# Patient Record
Sex: Male | Born: 1960 | Race: Black or African American | Hispanic: No | Marital: Single | Smoking: Current every day smoker
Health system: Southern US, Community
[De-identification: ages and names within clinical notes are randomized; demographics above are authoritative.]

## PROBLEM LIST (undated history)

## (undated) DIAGNOSIS — I10 Essential (primary) hypertension: Secondary | ICD-10-CM

## (undated) DIAGNOSIS — Z720 Tobacco use: Secondary | ICD-10-CM

## (undated) DIAGNOSIS — E119 Type 2 diabetes mellitus without complications: Secondary | ICD-10-CM

---

## 2016-07-15 ENCOUNTER — Encounter (HOSPITAL_COMMUNITY): Payer: Self-pay | Admitting: Emergency Medicine

## 2016-07-15 ENCOUNTER — Emergency Department (HOSPITAL_COMMUNITY): Payer: Self-pay

## 2016-07-15 ENCOUNTER — Observation Stay (HOSPITAL_COMMUNITY)
Admission: EM | Admit: 2016-07-15 | Discharge: 2016-07-15 | Disposition: A | Discharge status: Home / Self Care | Payer: Self-pay | Attending: Internal Medicine | Admitting: Internal Medicine

## 2016-07-15 DIAGNOSIS — F17210 Nicotine dependence, cigarettes, uncomplicated: Secondary | ICD-10-CM | POA: Insufficient documentation

## 2016-07-15 DIAGNOSIS — E876 Hypokalemia: Secondary | ICD-10-CM | POA: Insufficient documentation

## 2016-07-15 DIAGNOSIS — E119 Type 2 diabetes mellitus without complications: Secondary | ICD-10-CM | POA: Insufficient documentation

## 2016-07-15 DIAGNOSIS — Z7984 Long term (current) use of oral hypoglycemic drugs: Secondary | ICD-10-CM | POA: Insufficient documentation

## 2016-07-15 DIAGNOSIS — G459 Transient cerebral ischemic attack, unspecified: Principal | ICD-10-CM | POA: Diagnosis present

## 2016-07-15 DIAGNOSIS — I639 Cerebral infarction, unspecified: Secondary | ICD-10-CM

## 2016-07-15 DIAGNOSIS — I10 Essential (primary) hypertension: Secondary | ICD-10-CM | POA: Insufficient documentation

## 2016-07-15 DIAGNOSIS — G458 Other transient cerebral ischemic attacks and related syndromes: Secondary | ICD-10-CM

## 2016-07-15 DIAGNOSIS — Z720 Tobacco use: Secondary | ICD-10-CM

## 2016-07-15 HISTORY — DX: Type 2 diabetes mellitus without complications: E11.9

## 2016-07-15 HISTORY — DX: Tobacco use: Z72.0

## 2016-07-15 HISTORY — DX: Essential (primary) hypertension: I10

## 2016-07-15 LAB — I-STAT CHEM 8, ED
BUN: 16 mg/dL (ref 6–20)
CALCIUM ION: 1.05 mmol/L — AB (ref 1.15–1.40)
CHLORIDE: 102 mmol/L (ref 101–111)
CREATININE: 1.2 mg/dL (ref 0.61–1.24)
Glucose, Bld: 121 mg/dL — ABNORMAL HIGH (ref 65–99)
HCT: 41 % (ref 39.0–52.0)
Hemoglobin: 13.9 g/dL (ref 13.0–17.0)
Potassium: 2.9 mmol/L — ABNORMAL LOW (ref 3.5–5.1)
SODIUM: 140 mmol/L (ref 135–145)
TCO2: 26 mmol/L (ref 0–100)

## 2016-07-15 LAB — DIFFERENTIAL
Basophils Absolute: 0 10*3/uL (ref 0.0–0.1)
Basophils Relative: 0 %
Eosinophils Absolute: 0.3 10*3/uL (ref 0.0–0.7)
Eosinophils Relative: 3 %
LYMPHS ABS: 1.9 10*3/uL (ref 0.7–4.0)
LYMPHS PCT: 19 %
MONO ABS: 0.7 10*3/uL (ref 0.1–1.0)
Monocytes Relative: 7 %
NEUTROS ABS: 6.8 10*3/uL (ref 1.7–7.7)
NEUTROS PCT: 71 %

## 2016-07-15 LAB — LIPID PANEL
CHOL/HDL RATIO: 4.3 ratio
CHOLESTEROL: 196 mg/dL (ref 0–200)
HDL: 46 mg/dL (ref >40)
LDL Cholesterol: 138 mg/dL — ABNORMAL HIGH (ref 0–99)
TRIGLYCERIDES: 61 mg/dL (ref <150)
VLDL: 12 mg/dL (ref 0–40)

## 2016-07-15 LAB — COMPREHENSIVE METABOLIC PANEL
ALBUMIN: 3.4 g/dL — AB (ref 3.5–5.0)
ALK PHOS: 73 U/L (ref 38–126)
ALT: 23 U/L (ref 17–63)
AST: 29 U/L (ref 15–41)
Anion gap: 8 (ref 5–15)
BILIRUBIN TOTAL: 0.4 mg/dL (ref 0.3–1.2)
BUN: 15 mg/dL (ref 6–20)
CALCIUM: 8.6 mg/dL — AB (ref 8.9–10.3)
CO2: 26 mmol/L (ref 22–32)
CREATININE: 1.2 mg/dL (ref 0.61–1.24)
Chloride: 103 mmol/L (ref 101–111)
GFR calc Af Amer: >60 mL/min (ref >60)
GFR calc non Af Amer: >60 mL/min (ref >60)
GLUCOSE: 120 mg/dL — AB (ref 65–99)
Potassium: 2.9 mmol/L — ABNORMAL LOW (ref 3.5–5.1)
SODIUM: 137 mmol/L (ref 135–145)
Total Protein: 7.2 g/dL (ref 6.5–8.1)

## 2016-07-15 LAB — CBC
HCT: 39.3 % (ref 39.0–52.0)
HEMOGLOBIN: 12.8 g/dL — AB (ref 13.0–17.0)
MCH: 25.8 pg — ABNORMAL LOW (ref 26.0–34.0)
MCHC: 32.6 g/dL (ref 30.0–36.0)
MCV: 79.1 fL (ref 78.0–100.0)
PLATELETS: 322 10*3/uL (ref 150–400)
RBC: 4.97 MIL/uL (ref 4.22–5.81)
RDW: 16.4 % — ABNORMAL HIGH (ref 11.5–15.5)
WBC: 9.7 10*3/uL (ref 4.0–10.5)

## 2016-07-15 LAB — URINALYSIS, ROUTINE W REFLEX MICROSCOPIC
BILIRUBIN URINE: NEGATIVE
Glucose, UA: NEGATIVE mg/dL
Hgb urine dipstick: NEGATIVE
KETONES UR: NEGATIVE mg/dL
Leukocytes, UA: NEGATIVE
NITRITE: NEGATIVE
PROTEIN: NEGATIVE mg/dL
Specific Gravity, Urine: 1.015 (ref 1.005–1.030)
pH: 7.5 (ref 5.0–8.0)

## 2016-07-15 LAB — RAPID URINE DRUG SCREEN, HOSP PERFORMED
AMPHETAMINES: NOT DETECTED
BARBITURATES: NOT DETECTED
Benzodiazepines: NOT DETECTED
Cocaine: NOT DETECTED
Opiates: NOT DETECTED
TETRAHYDROCANNABINOL: NOT DETECTED

## 2016-07-15 LAB — ETHANOL: Alcohol, Ethyl (B): <5 mg/dL (ref <5)

## 2016-07-15 LAB — PROTIME-INR
INR: 1.08
PROTHROMBIN TIME: 14.1 s (ref 11.4–15.2)

## 2016-07-15 LAB — I-STAT TROPONIN, ED: Troponin i, poc: 0.02 ng/mL (ref 0.00–0.08)

## 2016-07-15 LAB — APTT: aPTT: 30 seconds (ref 24–36)

## 2016-07-15 LAB — CBG MONITORING, ED: GLUCOSE-CAPILLARY: 126 mg/dL — AB (ref 65–99)

## 2016-07-15 MED ORDER — ONDANSETRON HCL 4 MG/2ML IJ SOLN: 4.0000 mg | Freq: Three times a day (TID) | INTRAMUSCULAR | Status: DC | PRN

## 2016-07-15 MED ORDER — SENNOSIDES-DOCUSATE SODIUM 8.6-50 MG PO TABS
1.0000 | ORAL_TABLET | Freq: Every evening | ORAL | Status: DC | PRN
  Filled 2016-07-15: qty 1

## 2016-07-15 MED ORDER — INSULIN ASPART 100 UNIT/ML ~~LOC~~ SOLN: 0.0000 [IU] | Freq: Every day | SUBCUTANEOUS | Status: DC

## 2016-07-15 MED ORDER — STROKE: EARLY STAGES OF RECOVERY BOOK
Freq: Once | Status: DC
  Filled 2016-07-15: qty 1

## 2016-07-15 MED ORDER — CARVEDILOL 12.5 MG PO TABS: 25.0000 mg | ORAL_TABLET | Freq: Two times a day (BID) | ORAL | Status: DC

## 2016-07-15 MED ORDER — ASPIRIN 325 MG PO TABS: 325.0000 mg | ORAL_TABLET | Freq: Every day | ORAL | Status: DC

## 2016-07-15 MED ORDER — ZOLPIDEM TARTRATE 5 MG PO TABS: 5.0000 mg | ORAL_TABLET | Freq: Every evening | ORAL | Status: DC | PRN

## 2016-07-15 MED ORDER — ASPIRIN 300 MG RE SUPP: 300.0000 mg | Freq: Every day | RECTAL | Status: DC

## 2016-07-15 MED ORDER — POTASSIUM CHLORIDE CRYS ER 20 MEQ PO TBCR: 20.0000 meq | EXTENDED_RELEASE_TABLET | Freq: Once | ORAL | Status: DC

## 2016-07-15 MED ORDER — IOPAMIDOL (ISOVUE-370) INJECTION 76%
INTRAVENOUS | Status: AC
  Administered 2016-07-15: 75 mL
  Filled 2016-07-15: qty 100

## 2016-07-15 MED ORDER — ENOXAPARIN SODIUM 40 MG/0.4ML ~~LOC~~ SOLN: 40.0000 mg | SUBCUTANEOUS | Status: DC

## 2016-07-15 MED ORDER — ATORVASTATIN CALCIUM 40 MG PO TABS
40.0000 mg | ORAL_TABLET | Freq: Every day | ORAL | Status: DC
Start: 2016-07-15 — End: 2016-07-15

## 2016-07-15 MED ORDER — SODIUM CHLORIDE 0.9 % IV SOLN: INTRAVENOUS | Status: DC

## 2016-07-15 MED ORDER — INSULIN ASPART 100 UNIT/ML ~~LOC~~ SOLN
0.0000 [IU] | Freq: Three times a day (TID) | SUBCUTANEOUS | Status: DC
Start: 2016-07-15 — End: 2016-07-15

## 2016-07-15 MED ORDER — NICOTINE 21 MG/24HR TD PT24: 21.0000 mg | MEDICATED_PATCH | Freq: Every day | TRANSDERMAL | Status: DC

## 2016-07-15 MED ORDER — POTASSIUM CHLORIDE CRYS ER 20 MEQ PO TBCR
40.0000 meq | EXTENDED_RELEASE_TABLET | Freq: Once | ORAL | Status: AC
  Administered 2016-07-15: 40 meq via ORAL
  Filled 2016-07-15: qty 2

## 2016-07-15 MED ORDER — MAGNESIUM SULFATE 2 GM/50ML IV SOLN
2.0000 g | Freq: Once | INTRAVENOUS | Status: AC
  Administered 2016-07-15: 2 g via INTRAVENOUS
  Filled 2016-07-15: qty 50

## 2016-07-15 MED ORDER — ACETAMINOPHEN 325 MG PO TABS: 650.0000 mg | ORAL_TABLET | Freq: Four times a day (QID) | ORAL | Status: DC | PRN

## 2016-07-15 NOTE — ED Triage Notes (Signed)
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 BP 160/90, HR 72, R-20, SPO2 100 % RA.

## 2016-07-15 NOTE — ED Provider Notes (Addendum)
[facility]-EMERGENCY DEPT Provider Note   CSN: [id number] Arrival date & time: 07/15/16  0201   By signing my name below, I, Nelwyn Salisbury, attest that this documentation has been prepared under the direction and in the presence of Derwood Kaplan, MD . Electronically Signed: Nelwyn Salisbury, Scribe. 07/15/2016. 2:44 AM.  History   Chief Complaint Chief Complaint  Patient presents with  . Weakness   The history is provided by the patient. No language interpreter was used.    HPI Comments:  Adam Kaufman is a 55 y.o. male with pmhx of DM brought in by ambulance, who presents to the Emergency Department complaining of sudden-onset resolving weakness beginning about 3 hours ago. Pt states he went to sleep in his truck, woke up around midnight and noticed some weakness and left-sided numbness. He notes that his symptoms persisted for an hour after onset, but began resolving once EMS arrived. No modifying factors indicated. Pt denies any other new symptoms. He reports smoking 1 pack of cigarettes a day, but does not do any recreational drugs or drink alcohol.      Past Medical History:  Diagnosis Date  . Diabetes mellitus without complication (HCC)   . Hypertension   . Tobacco abuse     Patient Active Problem List   Diagnosis Date Noted  . TIA (transient ischemic attack) 07/15/2016  . Hypertension   . Diabetes mellitus without complication (HCC)   . Tobacco abuse   . Essential hypertension   . Hypokalemia     History reviewed. No pertinent surgical history.     Home Medications    Prior to Admission medications   Medication Sig Start Date End Date Taking? Authorizing Provider  carvedilol (COREG) 25 MG tablet Take 25 mg by mouth 2 (two) times daily with a meal.   Yes Historical Provider, MD  glipiZIDE (GLUCOTROL) 10 MG tablet Take 10 mg by mouth daily before breakfast.   Yes Historical Provider, MD    Family History History reviewed. No pertinent family history.  Social  History Social History  Substance Use Topics  . Smoking status: Current Every Day Smoker    Packs/day: 1.00    Types: Cigarettes  . Smokeless tobacco: Never Used  . Alcohol use No     Allergies   Patient has no known allergies.   Review of Systems Review of Systems 10 Systems reviewed and are negative for acute change except as noted in the HPI.  Physical Exam Updated Vital Signs BP 168/94   Pulse 68   Temp 98.6 F (37 C) (Oral)   Resp 17   Ht 6\' 1"  (1.854 m)   Wt 282 lb 10.1 oz (128.2 kg)   SpO2 96%   BMI 37.29 kg/m   Physical Exam  Constitutional: He is oriented to person, place, and time. He appears well-developed and well-nourished.  HENT:  Head: Normocephalic and atraumatic.  Eyes: EOM are normal. Pupils are equal, round, and reactive to light.  Pupils 1mm  Neck: Normal range of motion.  Cardiovascular: Normal rate, regular rhythm, normal heart sounds and intact distal pulses.   Pulmonary/Chest: Effort normal and breath sounds normal. No respiratory distress.  Abdominal: Soft. He exhibits no distension. There is no tenderness.  Musculoskeletal: Normal range of motion.  Neurological: He is alert and oriented to person, place, and time.  Cranial nerve 2-12 intact. Gross upper and lower sensory exam normal. Upper and lower extremity motor exam normal. Cerebellar exam shows no dysmetria. NIH stroke scale: 0  Skin:  Skin is warm and dry.  Psychiatric: He has a normal mood and affect. Judgment normal.  Nursing note and vitals reviewed.    ED Treatments / Results  DIAGNOSTIC STUDIES:  Oxygen Saturation is 97% on RA, normal by my interpretation.    COORDINATION OF CARE:  3:08 AM Discussed treatment plan with pt at bedside which includes correspondence with neurology and pt agreed to plan.  Labs (all labs ordered are listed, but only abnormal results are displayed) Labs Reviewed  CBC - Abnormal; Notable for the following:       Result Value   Hemoglobin  12.8 (*)    MCH 25.8 (*)    RDW 16.4 (*)    All other components within normal limits  COMPREHENSIVE METABOLIC PANEL - Abnormal; Notable for the following:    Potassium 2.9 (*)    Glucose, Bld 120 (*)    Calcium 8.6 (*)    Albumin 3.4 (*)    All other components within normal limits  URINALYSIS, ROUTINE W REFLEX MICROSCOPIC (NOT AT [facility]) - Abnormal; Notable for the following:    Color, Urine STRAW (*)    All other components within normal limits  I-STAT CHEM 8, ED - Abnormal; Notable for the following:    Potassium 2.9 (*)    Glucose, Bld 121 (*)    Calcium, Ion 1.05 (*)    All other components within normal limits  CBG MONITORING, ED - Abnormal; Notable for the following:    Glucose-Capillary 126 (*)    All other components within normal limits  ETHANOL  PROTIME-INR  APTT  DIFFERENTIAL  RAPID URINE DRUG SCREEN, HOSP PERFORMED  HEMOGLOBIN A1C  LIPID PANEL  I-STAT TROPOININ, ED    EKG  EKG Interpretation  Date/Time:  Monday July 15 2016 02:44:04 EST Ventricular Rate:  79 PR Interval:    QRS Duration: 102 QT Interval:  419 QTC Calculation: 481 R Axis:   31 Text Interpretation:  Sinus rhythm Probable left atrial enlargement LVH with secondary repolarization abnormality Borderline prolonged QT interval Confirmed by Rhunette Croft, MD, Janey Genta [phone]) on 07/15/2016 4:22:57 AM       Radiology Ct Angio Head W Or Wo Contrast  Result Date: 07/15/2016 CLINICAL DATA:  Left-sided facial droop EXAM: CT ANGIOGRAPHY HEAD AND NECK TECHNIQUE: Multidetector CT imaging of the head and neck was performed using the standard protocol during bolus administration of intravenous contrast. Multiplanar CT image reconstructions and MIPs were obtained to evaluate the vascular anatomy. Carotid stenosis measurements (when applicable) are obtained utilizing NASCET criteria, using the distal internal carotid diameter as the denominator. CONTRAST:  75 mL Isovue 370 IV injection of 4 milliliters/second  COMPARISON:  Head CT same day. FINDINGS: CTA NECK FINDINGS Aortic arch: There is a variant aortic arch branching pattern with the left vertebral artery arising independently from the arch. There is mild atherosclerotic calcification in the arch. Proximal subclavian arteries are normal. Right carotid system: There is no dissection, aneurysm or hemodynamically significant stenosis. Left carotid system: No dissection, aneurysm or hemodynamically significant stenosis. Vertebral arteries: Vertebral artery origins are patent. The vertebral system is right dominant. No evidence of aneurysm or dissection. Skeleton: Cervical degenerative changes without advanced bony spinal canal stenosis. Other neck: The nasopharynx is clear. The oropharynx and hypopharynx are normal. The epiglottis is normal. The supraglottic larynx, glottis and subglottic larynx are normal. No retropharyngeal collection. The parapharyngeal spaces are preserved. The parotid and submandibular glands are normal. No sialolithiasis or salivary ductal dilatation. The thyroid gland is normal.  There is no cervical lymphadenopathy. Upper chest: Clear Review of the MIP images confirms the above findings CTA HEAD FINDINGS Anterior circulation: --Intracranial internal carotid arteries: Minimal atherosclerotic calcification of the skullbase. --Anterior cerebral arteries: Normal. --Middle cerebral arteries: Normal. --Posterior communicating arteries: Present bilaterally. Posterior circulation: --Posterior cerebral arteries: Normal. --Superior cerebellar arteries: Normal. --Basilar artery: Normal. --Anterior inferior cerebellar arteries: Normal on the right. Not visualized on the left, which is not uncommon. --Posterior inferior cerebellar arteries: Normal. Venous sinuses: As permitted by contrast timing, patent. Anatomic variants: None Delayed phase: Not performed Review of the MIP images confirms the above findings IMPRESSION: 1. No occlusion or high-grade stenosis of  the intracranial arteries. 2. No aneurysm, dissection or hemodynamically significant stenosis of the cervical carotid and vertebral arteries. Electronically Signed   By: Deatra RobinsonKevin  Herman M.D.   On: 07/15/2016 03:07   Ct Angio Neck W And/or Wo Contrast  Result Date: 07/15/2016 CLINICAL DATA:  Left-sided facial droop EXAM: CT ANGIOGRAPHY HEAD AND NECK TECHNIQUE: Multidetector CT imaging of the head and neck was performed using the standard protocol during bolus administration of intravenous contrast. Multiplanar CT image reconstructions and MIPs were obtained to evaluate the vascular anatomy. Carotid stenosis measurements (when applicable) are obtained utilizing NASCET criteria, using the distal internal carotid diameter as the denominator. CONTRAST:  75 mL Isovue 370 IV injection of 4 milliliters/second COMPARISON:  Head CT same day. FINDINGS: CTA NECK FINDINGS Aortic arch: There is a variant aortic arch branching pattern with the left vertebral artery arising independently from the arch. There is mild atherosclerotic calcification in the arch. Proximal subclavian arteries are normal. Right carotid system: There is no dissection, aneurysm or hemodynamically significant stenosis. Left carotid system: No dissection, aneurysm or hemodynamically significant stenosis. Vertebral arteries: Vertebral artery origins are patent. The vertebral system is right dominant. No evidence of aneurysm or dissection. Skeleton: Cervical degenerative changes without advanced bony spinal canal stenosis. Other neck: The nasopharynx is clear. The oropharynx and hypopharynx are normal. The epiglottis is normal. The supraglottic larynx, glottis and subglottic larynx are normal. No retropharyngeal collection. The parapharyngeal spaces are preserved. The parotid and submandibular glands are normal. No sialolithiasis or salivary ductal dilatation. The thyroid gland is normal. There is no cervical lymphadenopathy. Upper chest: Clear Review of the  MIP images confirms the above findings CTA HEAD FINDINGS Anterior circulation: --Intracranial internal carotid arteries: Minimal atherosclerotic calcification of the skullbase. --Anterior cerebral arteries: Normal. --Middle cerebral arteries: Normal. --Posterior communicating arteries: Present bilaterally. Posterior circulation: --Posterior cerebral arteries: Normal. --Superior cerebellar arteries: Normal. --Basilar artery: Normal. --Anterior inferior cerebellar arteries: Normal on the right. Not visualized on the left, which is not uncommon. --Posterior inferior cerebellar arteries: Normal. Venous sinuses: As permitted by contrast timing, patent. Anatomic variants: None Delayed phase: Not performed Review of the MIP images confirms the above findings IMPRESSION: 1. No occlusion or high-grade stenosis of the intracranial arteries. 2. No aneurysm, dissection or hemodynamically significant stenosis of the cervical carotid and vertebral arteries. Electronically Signed   By: Deatra RobinsonKevin  Herman M.D.   On: 07/15/2016 03:07   Ct Head Code Stroke Wo Contrast`  Result Date: 07/15/2016 CLINICAL DATA:  Code stroke.  Left-sided facial droop EXAM: CT HEAD WITHOUT CONTRAST TECHNIQUE: Contiguous axial images were obtained from the base of the skull through the vertex without intravenous contrast. COMPARISON:  None. FINDINGS: Brain: No mass lesion, intraparenchymal hemorrhage or extra-axial collection. No evidence of acute cortical infarct. Brain parenchyma and CSF-containing spaces are normal for age. Vascular: No hyperdense vessel  or unexpected calcification. Skull: Normal visualized skull base, calvarium and extracranial soft tissues. Sinuses/Orbits: No sinus fluid levels or advanced mucosal thickening. No mastoid effusion. Normal orbits. ASPECTS [facility]([place] Stroke Program Early CT Score) - Ganglionic level infarction (caudate, lentiform nuclei, internal capsule, insula, M1-M3 cortex): 7 - Supraganglionic infarction (M4-M6 cortex):  3 Total score (0-10 with 10 being normal): 10 IMPRESSION: 1. No acute intracranial abnormality. 2. ASPECTS is 10. These results were called by telephone at the time of interpretation on 07/15/2016 at 2:31 am to Dr. Caryl PinaEric Lindzen, who verbally acknowledged these results. Electronically Signed   By: Deatra RobinsonKevin  Herman M.D.   On: 07/15/2016 02:30    Procedures Procedures (including critical care time)  Medications Ordered in ED Medications  potassium chloride SA (K-DUR,KLOR-CON) CR tablet 20 mEq (not administered)  carvedilol (COREG) tablet 25 mg (not administered)  nicotine (NICODERM CQ - dosed in mg/24 hours) patch 21 mg (not administered)  insulin aspart (novoLOG) injection 0-9 Units (not administered)  insulin aspart (novoLOG) injection 0-5 Units (not administered)  0.9 %  sodium chloride infusion (not administered)   stroke: mapping our early stages of recovery book (not administered)  senna-docusate (Senokot-S) tablet 1 tablet (not administered)  enoxaparin (LOVENOX) injection 40 mg (not administered)  aspirin suppository 300 mg (not administered)    Or  aspirin tablet 325 mg (not administered)  acetaminophen (TYLENOL) tablet 650 mg (not administered)  ondansetron (ZOFRAN) injection 4 mg (not administered)  zolpidem (AMBIEN) tablet 5 mg (not administered)  atorvastatin (LIPITOR) tablet 40 mg (not administered)  iopamidol (ISOVUE-370) 76 % injection (75 mLs  Contrast Given 07/15/16 0230)  potassium chloride SA (K-DUR,KLOR-CON) CR tablet 40 mEq (40 mEq Oral Given 07/15/16 0400)  magnesium sulfate IVPB 2 g 50 mL (2 g Intravenous New Bag/Given 07/15/16 0401)     Initial Impression / Assessment and Plan / ED Course  I have reviewed the triage vital signs and the nursing notes.  Pertinent labs & imaging results that were available during my care of the patient were reviewed by me and considered in my medical decision making (see chart for details).  Clinical Course    Pt comes in with  slurred speech and L sided weakness. Symptoms resolved. Suspect TIA.  ABCD2 score is 4 - moderate risk. Code stroke was called on the field -so neuro at bedside. CT -A ordered by hem and is neg for any occlusion.Pt has low K - replaced.  Neuro recommends admisison.   5:18 AM Patient wants to leave against medical advice. Patient understands that his actions will lead to inadequate medical workup, and that he is at risk of complications of missed diagnosis, which includes morbidity and mortality.  Pt understands that negative CT head, CT-angio doesn't mean everything is fine. Also MRI will pick up small strokes and echo will r/o and clots in the heart. Pt just wants to go home and take the chance. Patient is demonstrating good capacity to make decision. Patient understands that he/she needs to return to the ER immediately if his symptoms get worse.   Final Clinical Impressions(s) / ED Diagnoses   Final diagnoses:  Hypokalemia  Transient cerebral ischemia, unspecified type  Acute anterior circulation TIA  Essential hypertension  Diabetes mellitus without complication (HCC)  Tobacco abuse  TIA (transient ischemic attack)  TIA (transient ischemic attack)    New Prescriptions New Prescriptions   No medications on file  I personally performed the services described in this documentation, which was scribed in my presence. The  recorded information has been reviewed and is accurate.    Derwood Kaplan, MD 07/15/16 1610    Derwood Kaplan, MD 07/15/16 [id number]    Derwood Kaplan, MD 07/15/16 [id number]

## 2016-07-15 NOTE — ED Notes (Signed)
NIH = 0. Patient passed stroke swallow screen. No neuro deficits. Patient ambulatory. Uses urinal without assistance.  Next neuro check at 0600. Lucky RathkeBonnie Arieanna Pressey, RN  [phone]

## 2016-07-15 NOTE — H&P (Signed)
History and Physical    Adam Kaufman ZOX:[id number] DOB: [birth year] DOA: 07/15/2016  Referring MD/NP/PA:   PCP: No primary care provider on file.   Patient coming from:  The patient is coming from home.  At baseline, pt is independent for most of ADL.  Chief Complaint: Slurred speech, left facial numbness  HPI: Adam Kaufman is a 55 y.o. male with medical history significant of tobacco abuse, hypertension, diabetes mellitus, who presents with a slurred speech and left facial numbness.  pt is a Naval [employer]. He states that he went to sleep on the truck last night at 2100, feeling well, but when woke up at about 0030, he felt weak, numbness on left side of the face, had slurred speech. The symptoms lasted for about 45-50 minutes, resolved spontaneously. Patient denies unilateral weakness in extremities, vision change or hearing loss. He denies chest pain, shortness of breath, cough, fever, chills, nausea, vomiting, abdominal pain, symptoms of UTI. pt got 324 mg ASA on the truck station  ED Course: pt was found to have WBC 9.7, negative troponin, negative UDS, INR 1.08, negative urinalysis, potassium 2.9, creatinine 1.2, temperature normal, no tachycardia, no tachypnea, negative CT had for acute intracranial abnormalities. Negative CT angiogram of both head and neck. Pt is placed on tele bed for obs. Neurology was consulted.  Review of Systems:   General: no fevers, chills, no changes in body weight, has fatigue HEENT: no blurry vision, hearing changes or sore throat Respiratory: no dyspnea, coughing, wheezing CV: no chest pain, no palpitations GI: no nausea, vomiting, abdominal pain, diarrhea, constipation GU: no dysuria, burning on urination, increased urinary frequency, hematuria  Ext: no leg edema Neuro: has left facial numbness and slurred speech. no vision change or hearing loss Skin: no rash, no skin tear. MSK: No muscle spasm, no deformity, no limitation of range of movement in  spin Heme: No easy bruising.  Travel history: No recent long distant travel.  Allergy: No Known Allergies  Past Medical History:  Diagnosis Date  . Diabetes mellitus without complication (HCC)   . Hypertension   . Tobacco abuse     History reviewed. No pertinent surgical history.  Social History:  reports that he has been smoking Cigarettes.  He has been smoking about 1.00 pack per day. He has never used smokeless tobacco. He reports that he does not drink alcohol or use drugs.  Family History: Reviewed with patient, but patient does not know any empty medical history.   Prior to Admission medications   Medication Sig Start Date End Date Taking? Authorizing Provider  carvedilol (COREG) 25 MG tablet Take 25 mg by mouth 2 (two) times daily with a meal.   Yes Historical Provider, MD  glipiZIDE (GLUCOTROL) 10 MG tablet Take 10 mg by mouth daily before breakfast.   Yes Historical Provider, MD    Physical Exam: Vitals:   07/15/16 0254 07/15/16 0400 07/15/16 0500 07/15/16 0515  BP:  168/94 160/97 156/91  Pulse:  68    Resp:  Temp:      TempSrc:      SpO2:  96%    Weight: 128.2 kg (282 lb 10.1 oz)     Height:  (1.854 m)      General: Not in acute distress HEENT:       Eyes: PERRL, EOMI, no scleral icterus.       ENT: No discharge from the ears and nose, no pharynx injection, no tonsillar enlargement.  Neck: No JVD, no bruit, no mass felt. Heme: No neck lymph node enlargement. Cardiac: S1/S2, RRR, No murmurs, No gallops or rubs. Respiratory: Good air movement bilaterally. No rales, wheezing, rhonchi or rubs. GI: Soft, nondistended, nontender, no rebound pain, no organomegaly, BS present. GU: No hematuria Ext: No pitting leg edema bilaterally. 2+DP/PT pulse bilaterally. Musculoskeletal: No joint deformities, No joint redness or warmth, no limitation of ROM in spin. Skin: No rashes.  Neuro: Alert, oriented X3, cranial nerves II-XII grossly intact, moves  all extremities normally. Muscle strength 5/5 in all extremities, sensation to light touch intact. Brachial reflex 2+ bilaterally. Knee reflex 1+ bilaterally. Negative Babinski's sign. Normal finger to nose test. Psych: Patient is not psychotic, no suicidal or hemocidal ideation.  Labs on Admission: I have personally reviewed following labs and imaging studies  CBC:  Recent Labs Lab 07/15/16 0205 07/15/16 0210  WBC 9.7  --   NEUTROABS 6.8  --   HGB 12.8* 13.9  HCT 39.3 41.0  MCV 79.1  --   PLT 322  --    Basic Metabolic Panel:  Recent Labs Lab 07/15/16 0205 07/15/16 0210  NA 137 140  K 2.9* 2.9*  CL 103 102  CO2 26  --   GLUCOSE 120* 121*  BUN 15 16  CREATININE 1.20 1.20  CALCIUM 8.6*  --    GFR: Estimated Creatinine Clearance: 96.4 mL/min (by C-G formula based on SCr of 1.2 mg/dL). Liver Function Tests:  Recent Labs Lab 07/15/16 0205  AST 29  ALT 23  ALKPHOS 73  BILITOT 0.4  PROT 7.2  ALBUMIN 3.4*   No results for input(s): LIPASE, AMYLASE in the last 168 hours. No results for input(s): AMMONIA in the last 168 hours. Coagulation Profile:  Recent Labs Lab 07/15/16 0205  INR 1.08   Cardiac Enzymes: No results for input(s): CKTOTAL, CKMB, CKMBINDEX, TROPONINI in the last 168 hours. BNP (last 3 results) No results for input(s): PROBNP in the last 8760 hours. HbA1C: No results for input(s): HGBA1C in the last 72 hours. CBG:  Recent Labs Lab 07/15/16 0246  GLUCAP 126*   Lipid Profile: No results for input(s): CHOL, HDL, LDLCALC, TRIG, CHOLHDL, LDLDIRECT in the last 72 hours. Thyroid Function Tests: No results for input(s): TSH, T4TOTAL, FREET4, T3FREE, THYROIDAB in the last 72 hours. Anemia Panel: No results for input(s): VITAMINB12, FOLATE, FERRITIN, TIBC, IRON, RETICCTPCT in the last 72 hours. Urine analysis:    Component Value Date/Time   COLORURINE STRAW (A) 07/15/2016 0203   APPEARANCEUR CLEAR 07/15/2016 0203   LABSPEC 1.015 07/15/2016  0203   PHURINE 7.5 07/15/2016 0203   GLUCOSEU NEGATIVE 07/15/2016 0203   HGBUR NEGATIVE 07/15/2016 0203   BILIRUBINUR NEGATIVE 07/15/2016 0203   KETONESUR NEGATIVE 07/15/2016 0203   PROTEINUR NEGATIVE 07/15/2016 0203   NITRITE NEGATIVE 07/15/2016 0203   LEUKOCYTESUR NEGATIVE 07/15/2016 0203   Sepsis Labs: @LABRCNTIP (procalcitonin:4,lacticidven:4) )No results found for this or any previous visit (from the past 240 hour(s)).   Radiological Exams on Admission: Ct Angio Head W Or Wo Contrast  Result Date: 07/15/2016 CLINICAL DATA:  Left-sided facial droop EXAM: CT ANGIOGRAPHY HEAD AND NECK TECHNIQUE: Multidetector CT imaging of the head and neck was performed using the standard protocol during bolus administration of intravenous contrast. Multiplanar CT image reconstructions and MIPs were obtained to evaluate the vascular anatomy. Carotid stenosis measurements (when applicable) are obtained utilizing NASCET criteria, using the distal internal carotid diameter as the denominator. CONTRAST:  75 mL Isovue 370 IV injection of  4 milliliters/second COMPARISON:  Head CT same day. FINDINGS: CTA NECK FINDINGS Aortic arch: There is a variant aortic arch branching pattern with the left vertebral artery arising independently from the arch. There is mild atherosclerotic calcification in the arch. Proximal subclavian arteries are normal. Right carotid system: There is no dissection, aneurysm or hemodynamically significant stenosis. Left carotid system: No dissection, aneurysm or hemodynamically significant stenosis. Vertebral arteries: Vertebral artery origins are patent. The vertebral system is right dominant. No evidence of aneurysm or dissection. Skeleton: Cervical degenerative changes without advanced bony spinal canal stenosis. Other neck: The nasopharynx is clear. The oropharynx and hypopharynx are normal. The epiglottis is normal. The supraglottic larynx, glottis and subglottic larynx are normal. No  retropharyngeal collection. The parapharyngeal spaces are preserved. The parotid and submandibular glands are normal. No sialolithiasis or salivary ductal dilatation. The thyroid gland is normal. There is no cervical lymphadenopathy. Upper chest: Clear Review of the MIP images confirms the above findings CTA HEAD FINDINGS Anterior circulation: --Intracranial internal carotid arteries: Minimal atherosclerotic calcification of the skullbase. --Anterior cerebral arteries: Normal. --Middle cerebral arteries: Normal. --Posterior communicating arteries: Present bilaterally. Posterior circulation: --Posterior cerebral arteries: Normal. --Superior cerebellar arteries: Normal. --Basilar artery: Normal. --Anterior inferior cerebellar arteries: Normal on the right. Not visualized on the left, which is not uncommon. --Posterior inferior cerebellar arteries: Normal. Venous sinuses: As permitted by contrast timing, patent. Anatomic variants: None Delayed phase: Not performed Review of the MIP images confirms the above findings IMPRESSION: 1. No occlusion or high-grade stenosis of the intracranial arteries. 2. No aneurysm, dissection or hemodynamically significant stenosis of the cervical carotid and vertebral arteries. Electronically Signed   By: Deatra Robinson M.D.   On: 07/15/2016 03:07   Ct Angio Neck W And/or Wo Contrast  Result Date: 07/15/2016 CLINICAL DATA:  Left-sided facial droop EXAM: CT ANGIOGRAPHY HEAD AND NECK TECHNIQUE: Multidetector CT imaging of the head and neck was performed using the standard protocol during bolus administration of intravenous contrast. Multiplanar CT image reconstructions and MIPs were obtained to evaluate the vascular anatomy. Carotid stenosis measurements (when applicable) are obtained utilizing NASCET criteria, using the distal internal carotid diameter as the denominator. CONTRAST:  75 mL Isovue 370 IV injection of 4 milliliters/second COMPARISON:  Head CT same day. FINDINGS: CTA NECK  FINDINGS Aortic arch: There is a variant aortic arch branching pattern with the left vertebral artery arising independently from the arch. There is mild atherosclerotic calcification in the arch. Proximal subclavian arteries are normal. Right carotid system: There is no dissection, aneurysm or hemodynamically significant stenosis. Left carotid system: No dissection, aneurysm or hemodynamically significant stenosis. Vertebral arteries: Vertebral artery origins are patent. The vertebral system is right dominant. No evidence of aneurysm or dissection. Skeleton: Cervical degenerative changes without advanced bony spinal canal stenosis. Other neck: The nasopharynx is clear. The oropharynx and hypopharynx are normal. The epiglottis is normal. The supraglottic larynx, glottis and subglottic larynx are normal. No retropharyngeal collection. The parapharyngeal spaces are preserved. The parotid and submandibular glands are normal. No sialolithiasis or salivary ductal dilatation. The thyroid gland is normal. There is no cervical lymphadenopathy. Upper chest: Clear Review of the MIP images confirms the above findings CTA HEAD FINDINGS Anterior circulation: --Intracranial internal carotid arteries: Minimal atherosclerotic calcification of the skullbase. --Anterior cerebral arteries: Normal. --Middle cerebral arteries: Normal. --Posterior communicating arteries: Present bilaterally. Posterior circulation: --Posterior cerebral arteries: Normal. --Superior cerebellar arteries: Normal. --Basilar artery: Normal. --Anterior inferior cerebellar arteries: Normal on the right. Not visualized on the left, which is  not uncommon. --Posterior inferior cerebellar arteries: Normal. Venous sinuses: As permitted by contrast timing, patent. Anatomic variants: None Delayed phase: Not performed Review of the MIP images confirms the above findings IMPRESSION: 1. No occlusion or high-grade stenosis of the intracranial arteries. 2. No aneurysm,  dissection or hemodynamically significant stenosis of the cervical carotid and vertebral arteries. Electronically Signed   By: Deatra RobinsonKevin  Herman M.D.   On: 07/15/2016 03:07   Ct Head Code Stroke Wo Contrast`  Result Date: 07/15/2016 CLINICAL DATA:  Code stroke.  Left-sided facial droop EXAM: CT HEAD WITHOUT CONTRAST TECHNIQUE: Contiguous axial images were obtained from the base of the skull through the vertex without intravenous contrast. COMPARISON:  None. FINDINGS: Brain: No mass lesion, intraparenchymal hemorrhage or extra-axial collection. No evidence of acute cortical infarct. Brain parenchyma and CSF-containing spaces are normal for age. Vascular: No hyperdense vessel or unexpected calcification. Skull: Normal visualized skull base, calvarium and extracranial soft tissues. Sinuses/Orbits: No sinus fluid levels or advanced mucosal thickening. No mastoid effusion. Normal orbits. ASPECTS [facility]([place]ta Stroke Program Early CT Score) - Ganglionic level infarction (caudate, lentiform nuclei, internal capsule, insula, M1-M3 cortex): 7 - Supraganglionic infarction (M4-M6 cortex): 3 Total score (0-10 with 10 being normal): 10 IMPRESSION: 1. No acute intracranial abnormality. 2. ASPECTS is 10. These results were called by telephone at the time of interpretation on 07/15/2016 at 2:31 am to Dr. Caryl PinaEric Lindzen, who verbally acknowledged these results. Electronically Signed   By: Deatra RobinsonKevin  Herman M.D.   On: 07/15/2016 02:30     EKG: Independently reviewed.  QTC 481, LVH, T-wave inversion in lateral leads and in V4-V6   Assessment/Plan Principal Problem:   TIA (transient ischemic attack) Active Problems:   Hypertension   Diabetes mellitus without complication (HCC)   Tobacco abuse   TIA (transient ischemic attack): Patient's symptoms are concerning for TIA. His risk factors include tobacco abuse, hypertension, diabetes and old age. No sings of infection. CT head is negative. CT angiogram of both neck and head are  negative.  - will  Place on tele bed for obs - Neurology was consulted by EDP, will follow up recommendations. -Atrial fibrillation: not present  - Risk factor modification: HgbA1c, fasting lipid panel - MRI, MRA of the brain without contrast  - PT consult, OT consult - Bedside swallowing screen was ordered, will get speech consult in AM - 2 d Echocardiogram  - Aspirin - pt is not safe to drive even he has negative MRI, pt was educated about this.  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 Patient is taking glipizide at home -SSI -Check A1c  Tobacco abuse: -Did counseling about importance of quitting smoking -Nicotine patch   DVT ppx: SQ Lovenox Code Status: Full code Family Communication: None at bed side. Disposition Plan:  Anticipate discharge back to previous home environment Consults called:  Neurology, Dr. Otelia LimesLindzen Admission status: Obs / tele     Date of Service 07/15/2016    Lorretta HarpIU, Hanad Leino [facility] Pager [phone]  If 7PM-7AM, please contact night-coverage [email or web address] Password [facility] 07/15/2016, 5:37 AM

## 2016-07-15 NOTE — ED Notes (Signed)
Pt oriented about leaving AMA, pt still states that he doesn't believe he needs to stay on the hospital that all the test ordered were normal and he doesn't want to spend Holidays far away from home.

## 2016-07-15 NOTE — Consult Note (Signed)
Referring Physician: Dr. Allena KatzPatel    Chief Complaint: Acute onset of left facial numbness, left facial droop and left arm weakness with difficulty speaking  HPI: Adam Kaufman is an 55 y.o. male who awoke from sleep in his truck at a truck stop with symptoms of shortness of breath, left facial numbness, trouble speaking and "not feeling right". This continued when he walked into the main building at the truck stop. He called EMS. At the scene paramedics noted him to have left facial droop, left arm weakness and dysphasia. By the time he arrived at the ED, his symptoms had resolved and repeat exam by EMS was negative.   He states that he has a history of DM. Denies HTN. No prior history of stroke.   LSN: 10:30 PM tPA Given: No, due to complete resolution of deficits.   No past medical history on file.  No past surgical history on file.  No family history on file. Social History:  has no tobacco, alcohol, and drug history on file.  Allergies: Allergies not on file  Medications: States that he takes a "diabetes pill". He does not take insulin.    ROS: No headache, neck pain, chest pain, chest pressure, abdominal pain or limb pain. Endorses congestion/stuffy sensation in forehead region.   Physical Examination: Vitals from EMS chart: 160/90, 72, 20,100% on RA  General examination:  [place]/AT, NAD, respirations unlabored, extremities warm and well-perfused.  Neurologic Examination: Ment: Alert and fully oriented. Speech fluent with intact comprehension and naming. No dysarthria. Pleasant and cooperative.  CN: PERRL, visual fields intact. EOMI without nystagmus. Facial sensation intact to temp bilaterally. Smile is symmetric. Hearing intact to questions and commands. Shoulder shrug symmetric. Tongue protrudes midline.  Motor: 5/5 in all 4 extremities proximally and distally. No pronator drift.  Sensory: Intact to FT and temp x 4 without extinction.  Reflexes: Normoactive x 4.  Cerebellar: No  ataxia on FNF bilaterally.  Gait: Deferred.   Results for orders placed or performed during the hospital encounter of 07/15/16 (from the past 48 hour(s))  CBC     Status: Abnormal   Collection Time: 07/15/16  2:05 AM  Result Value Ref Range   WBC 9.7 4.0 - 10.5 K/uL   RBC 4.97 4.22 - 5.81 MIL/uL   Hemoglobin 12.8 (L) 13.0 - 17.0 g/dL   HCT 86.539.3 78.439.0 - 69.652.0 %   MCV 79.1 78.0 - 100.0 fL   MCH 25.8 (L) 26.0 - 34.0 pg   MCHC 32.6 30.0 - 36.0 g/dL   RDW 29.516.4 (H) 28.411.5 - 13.215.5 %   Platelets 322 150 - 400 K/uL  Differential     Status: None   Collection Time: 07/15/16  2:05 AM  Result Value Ref Range   Neutrophils Relative % 71 %   Neutro Abs 6.8 1.7 - 7.7 K/uL   Lymphocytes Relative 19 %   Lymphs Abs 1.9 0.7 - 4.0 K/uL   Monocytes Relative 7 %   Monocytes Absolute 0.7 0.1 - 1.0 K/uL   Eosinophils Relative 3 %   Eosinophils Absolute 0.3 0.0 - 0.7 K/uL   Basophils Relative 0 %   Basophils Absolute 0.0 0.0 - 0.1 K/uL  [facility]Stat Chem 8, ED  [facility]     Status: Abnormal   Collection Time: 07/15/16  2:10 AM  Result Value Ref Range   Sodium 140 135 - 145 mmol/L   Potassium 2.9 (L) 3.5 - 5.1 mmol/L   Chloride 102 101 - 111 mmol/L  BUN 16 6 - 20 mg/dL   Creatinine, Ser 1.611.20 0.61 - 1.24 mg/dL   Glucose, Bld 096121 (H) 65 - 99 mg/dL   Calcium, Ion 0.451.05 (L) 1.15 - 1.40 mmol/L   TCO2 26 0 - 100 mmol/L   Hemoglobin 13.9 13.0 - 17.0 g/dL   HCT 40.941.0 81.139.0 - 91.452.0 %   No results found.  Assessment: 55 y.o. male with acute onset of left sided weakness, now resolved.  1. TIA. CT revealed no acute findings. CTA head revealed no occlusion or high-grade stenosis of the intracranial arteries. CTA neck revealed no aneurysm, dissection or hemodynamically significant stenosis of the cervical carotid and vertebral arteries. Not a tPA or endovascular candidate due to resolved deficits.  2. Hypokalemia.  3. DM.   4. Low serum ionized calcium level.    Plan: 1. HgbA1c, fasting lipid panel 2. TTE  and MRI brain to complete work up. These can be obtained as outpatient if completed in the next week.  3. PT consult, OT consult, Speech consult 4. ASA 81 mg po qd.  5. Outpatient follow up with Neurology.  6. May benefit from atorvastatin for stroke prevention.  7. Permissive HTN.  8. Telemetry monitoring.   @Electronically  signed: Dr. Caryl PinaEric Belmira Daley@   @[email or web address] @ 07/15/2016, 2:20 AM

## 2016-07-17 LAB — HEMOGLOBIN A1C
HEMOGLOBIN A1C: 5.8 % — AB (ref 4.8–5.6)
MEAN PLASMA GLUCOSE: 120 mg/dL

## 2017-06-16 IMAGING — CT CT HEAD CODE STROKE
3 series · 16 of 47 positions shown, 19 images · non-contrast
Comparison: None.

CLINICAL DATA: Code stroke.  Left-sided facial droop

EXAM:
CT HEAD WITHOUT CONTRAST
TECHNIQUE: Contiguous axial images were obtained from the base of the skull
through the vertex without intravenous contrast.

[Series 2: head 5.0 st · axial · 0.44mm/px · z∈[-126,+14]mm · 10 of 34 slices shown, 13 images]
[im 3/34  brain]
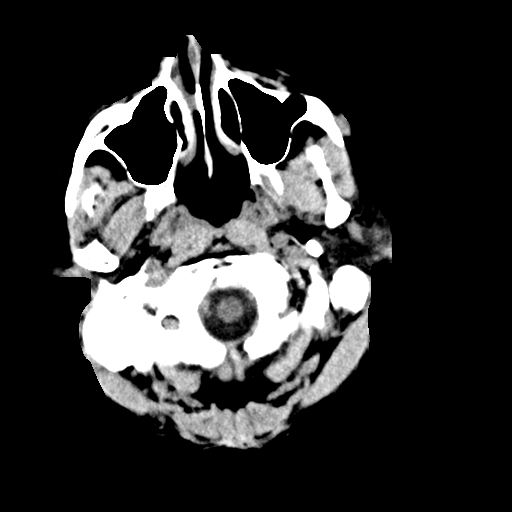
[im 3/34  bone]
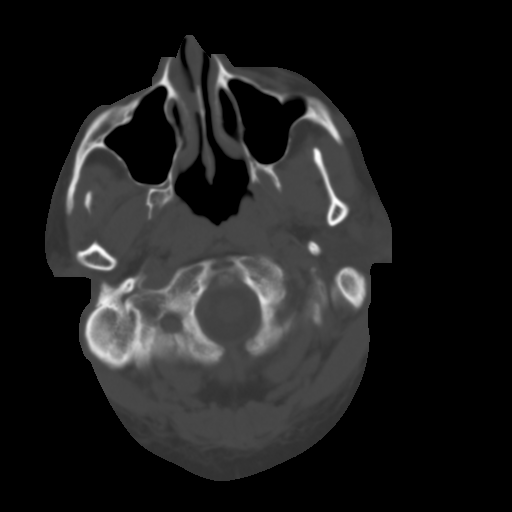
[im 6/34  brain]
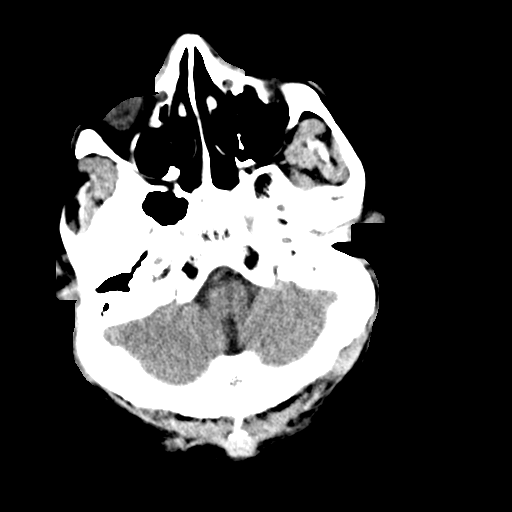
[im 10/34  brain]
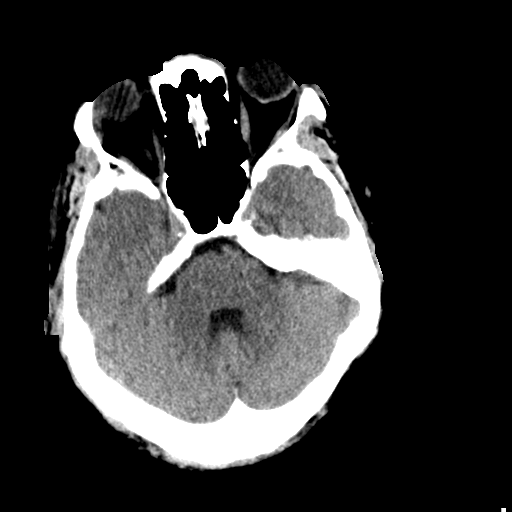
[im 12/34  brain]
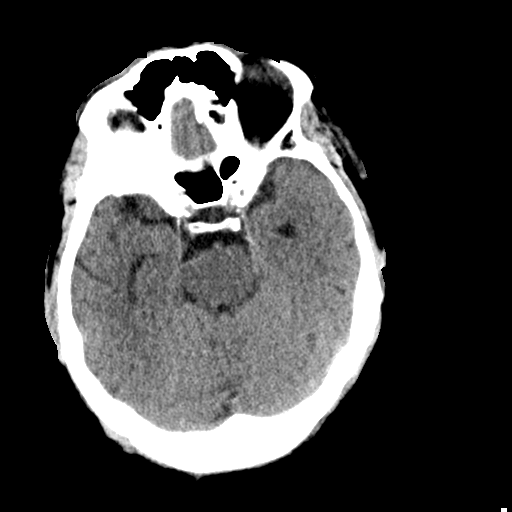
[im 15/34  brain]
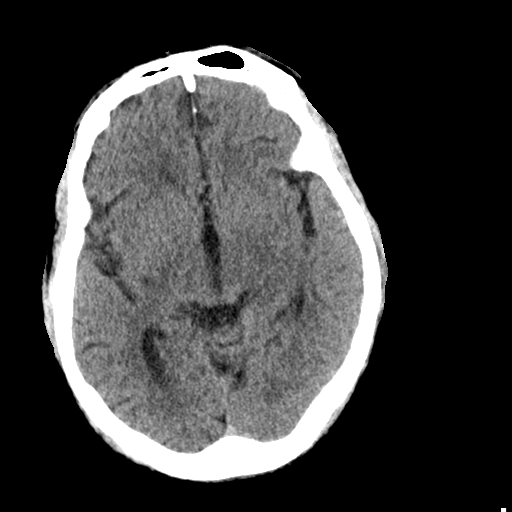
[im 15/34  bone]
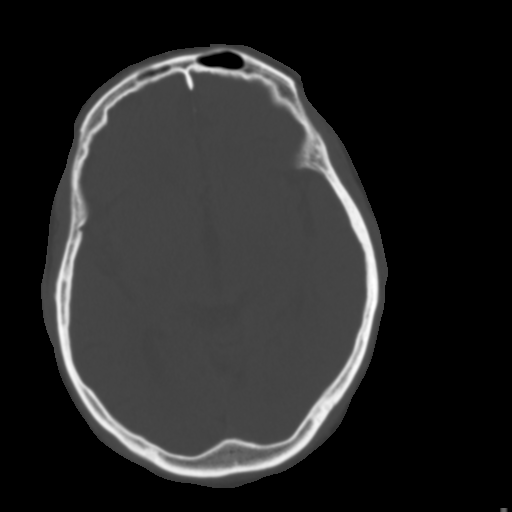
[im 19/34  brain]
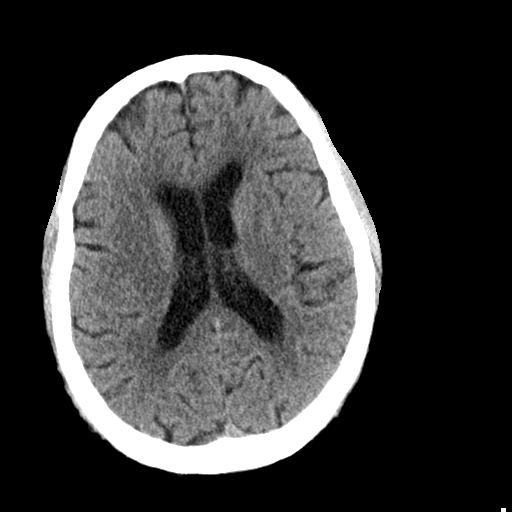
[im 22/34  brain]
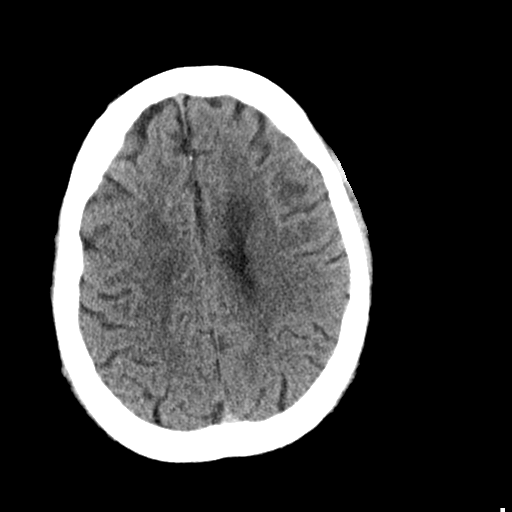
[im 26/34  brain]
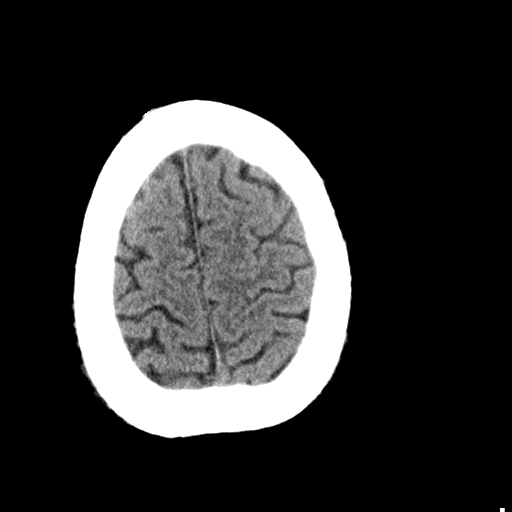
[im 28/34  brain]
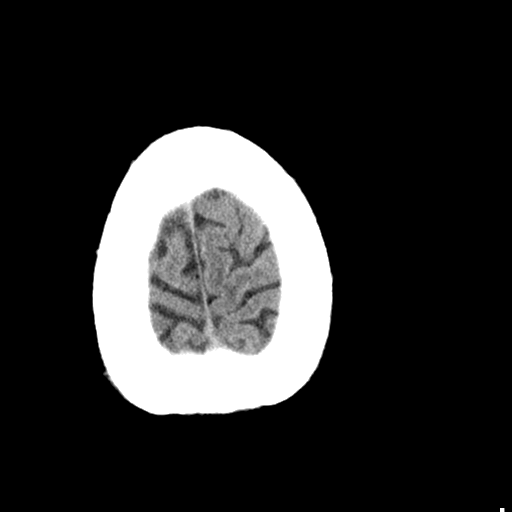
[im 28/34  bone]
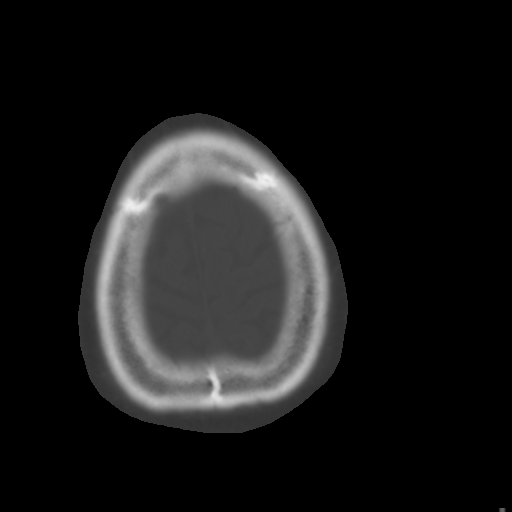
[im 31/34  brain]
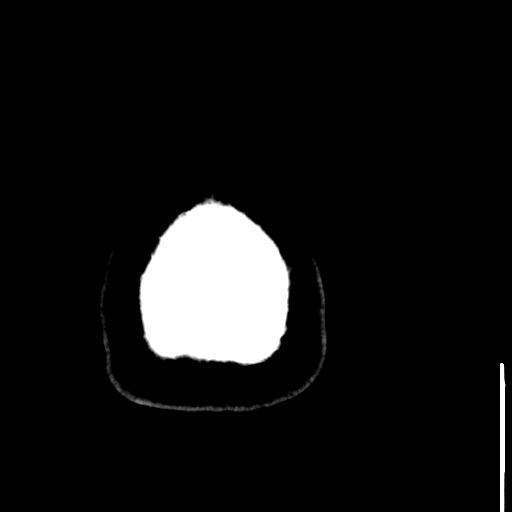

[Series 4: head 3.0 cor st · coronal · 0.35mm/px · 3 of 72 slices shown]
[im 24/72  brain]
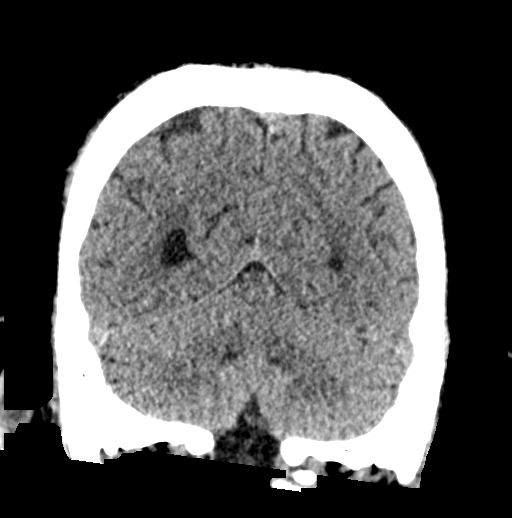
[im 32/72  brain]
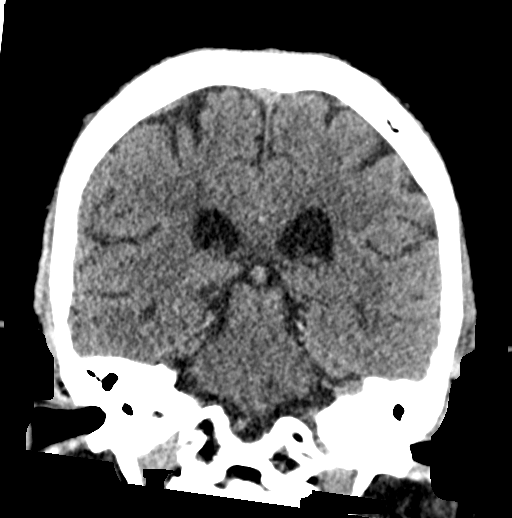
[im 40/72  brain]
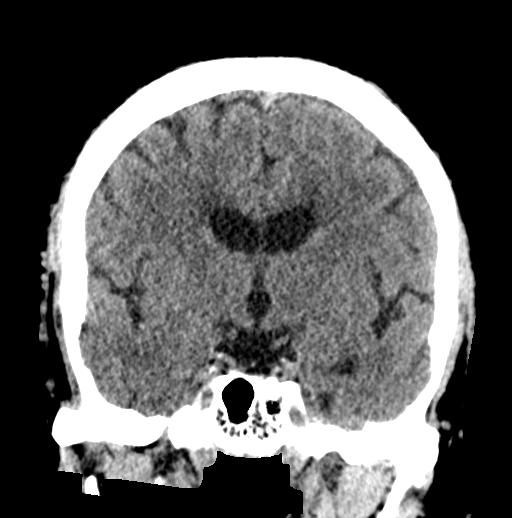

[Series 5: head 3.0 sag st · sagittal · 0.35mm/px · 3 of 67 slices shown]
[im 24/67  brain]
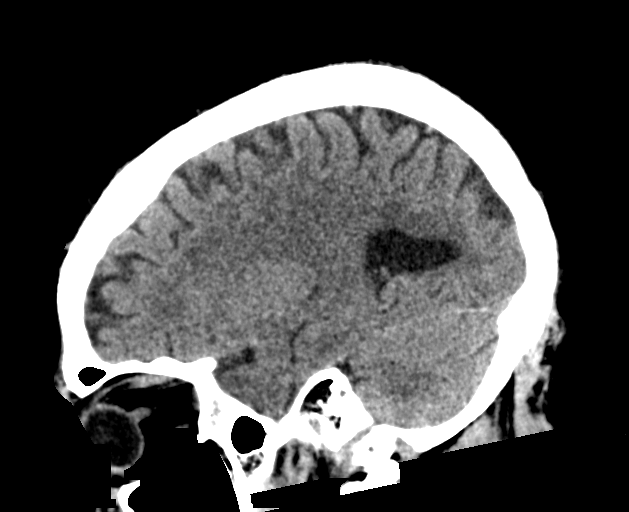
[im 34/67  brain]
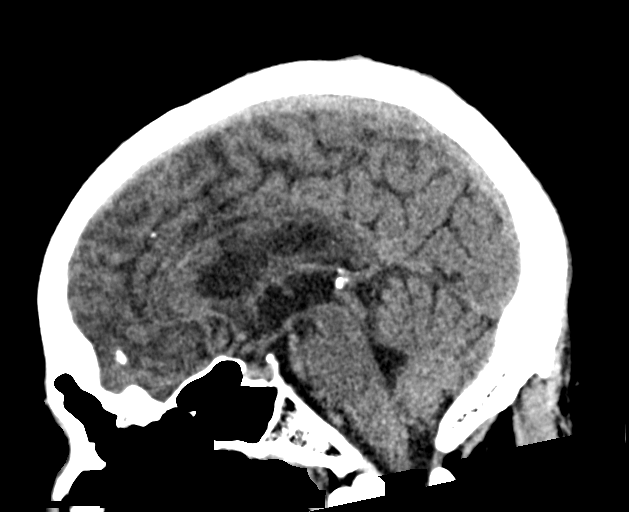
[im 44/67  brain]
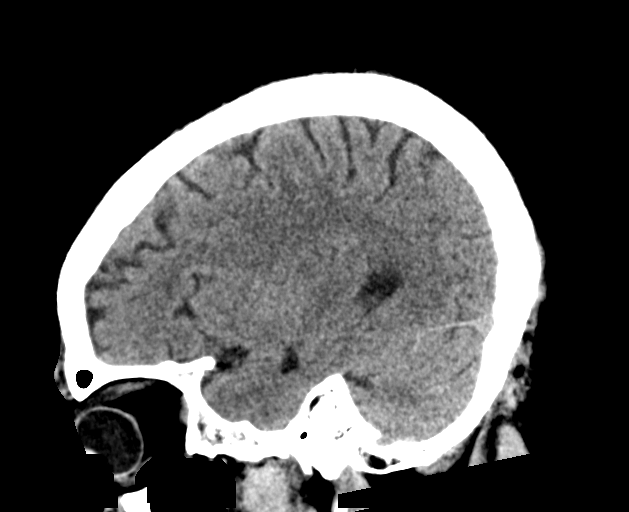

[16 of 47 positions shown; findings below may reference images not displayed]

FINDINGS: Brain: No mass lesion, intraparenchymal hemorrhage or extra-axial
collection. No evidence of acute cortical infarct. Brain parenchyma
and CSF-containing spaces are normal for age.

Vascular: No hyperdense vessel or unexpected calcification.

Skull: Normal visualized skull base, calvarium and extracranial soft
tissues.

Sinuses/Orbits: No sinus fluid levels or advanced mucosal
thickening. No mastoid effusion. Normal orbits.

ASPECTS (Al[place]ta Stroke Program Early CT Score)

- Ganglionic level infarction (caudate, lentiform nuclei, internal
capsule, insula, M1-M3 cortex): 7

- Supraganglionic infarction (M4-M6 cortex): 3

Total score (0-10 with 10 being normal): 10
IMPRESSION: 1. No acute intracranial abnormality.
2. ASPECTS is 10.
These results were called by telephone at the time of interpretation
on 07/15/2016 at [DATE] to Dr. Rendolf Wim, who verbally
acknowledged these results.

## 2021-05-26 DEATH — deceased
